# Patient Record
Sex: Male | Born: 1995 | Race: White | Hispanic: No | Smoking: Never smoker
Health system: Southern US, Community
[De-identification: ages and names within clinical notes are randomized; demographics above are authoritative.]

## PROBLEM LIST (undated history)

## (undated) HISTORY — PX: CIRCUMCISION: SHX1350

---
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
## 2017-06-26 DIAGNOSIS — Z23 Encounter for immunization: Secondary | ICD-10-CM | POA: Diagnosis not present

## 2017-06-26 DIAGNOSIS — L700 Acne vulgaris: Secondary | ICD-10-CM | POA: Diagnosis not present

## 2017-06-26 DIAGNOSIS — L649 Androgenic alopecia, unspecified: Secondary | ICD-10-CM | POA: Diagnosis not present

## 2018-03-19 DIAGNOSIS — Z13220 Encounter for screening for lipoid disorders: Secondary | ICD-10-CM | POA: Diagnosis not present

## 2018-03-19 DIAGNOSIS — Z0000 Encounter for general adult medical examination without abnormal findings: Secondary | ICD-10-CM | POA: Diagnosis not present

## 2018-03-19 DIAGNOSIS — Z130 Encounter for screening for diseases of the blood and blood-forming organs and certain disorders involving the immune mechanism: Secondary | ICD-10-CM | POA: Diagnosis not present

## 2018-10-19 ENCOUNTER — Ambulatory Visit (INDEPENDENT_AMBULATORY_CARE_PROVIDER_SITE_OTHER): Payer: Self-pay | Admitting: Nurse Practitioner

## 2018-10-19 DIAGNOSIS — Z111 Encounter for screening for respiratory tuberculosis: Secondary | ICD-10-CM | POA: Insufficient documentation

## 2018-10-19 DIAGNOSIS — Z23 Encounter for immunization: Secondary | ICD-10-CM

## 2018-10-19 NOTE — Progress Notes (Signed)
Patient presents for PPD placement for  Denies previous positive TB test  Denies known exposure to TB   Tuberculin skin test applied to right forearm.  Patient informed to return to [facility] in 48-72 hours for PPD read. Vaccine Information Statement provided to patient.  Pt presents here today for visit to receive Influenza(right deltoid) vaccine. Allergies reviewed, vaccine given, vaccine information statement provided, tolerated well.

## 2018-10-22 LAB — TB SKIN TEST
INDURATION: 0 mm
TB Skin Test: NEGATIVE

## 2021-01-05 DIAGNOSIS — J3081 Allergic rhinitis due to animal (cat) (dog) hair and dander: Secondary | ICD-10-CM | POA: Diagnosis not present

## 2021-01-05 DIAGNOSIS — J3089 Other allergic rhinitis: Secondary | ICD-10-CM | POA: Diagnosis not present

## 2021-01-05 DIAGNOSIS — Z91013 Allergy to seafood: Secondary | ICD-10-CM | POA: Diagnosis not present

## 2022-04-14 ENCOUNTER — Other Ambulatory Visit (HOSPITAL_COMMUNITY): Payer: Self-pay

## 2022-08-08 ENCOUNTER — Ambulatory Visit: Payer: 59 | Admitting: Nurse Practitioner

## 2023-12-30 ENCOUNTER — Emergency Department
Admission: EM | Admit: 2023-12-30 | Discharge: 2023-12-30 | Disposition: A | Discharge status: Home / Self Care | Attending: Emergency Medicine | Admitting: Emergency Medicine

## 2023-12-30 ENCOUNTER — Encounter: Payer: Self-pay | Admitting: Intensive Care

## 2023-12-30 ENCOUNTER — Other Ambulatory Visit: Payer: Self-pay

## 2023-12-30 ENCOUNTER — Emergency Department

## 2023-12-30 DIAGNOSIS — R112 Nausea with vomiting, unspecified: Secondary | ICD-10-CM

## 2023-12-30 DIAGNOSIS — R1033 Periumbilical pain: Secondary | ICD-10-CM | POA: Insufficient documentation

## 2023-12-30 DIAGNOSIS — E860 Dehydration: Secondary | ICD-10-CM

## 2023-12-30 DIAGNOSIS — K5730 Diverticulosis of large intestine without perforation or abscess without bleeding: Secondary | ICD-10-CM | POA: Diagnosis not present

## 2023-12-30 DIAGNOSIS — R197 Diarrhea, unspecified: Secondary | ICD-10-CM | POA: Insufficient documentation

## 2023-12-30 DIAGNOSIS — R1031 Right lower quadrant pain: Secondary | ICD-10-CM | POA: Diagnosis not present

## 2023-12-30 LAB — LIPASE, BLOOD: Lipase: 32 U/L (ref 11–51)

## 2023-12-30 LAB — URINALYSIS, ROUTINE W REFLEX MICROSCOPIC
Bilirubin Urine: NEGATIVE
Glucose, UA: NEGATIVE mg/dL
Hgb urine dipstick: NEGATIVE
Ketones, ur: 20 mg/dL — AB
Leukocytes,Ua: NEGATIVE
Nitrite: NEGATIVE
Protein, ur: NEGATIVE mg/dL
Specific Gravity, Urine: 1.018 (ref 1.005–1.030)
pH: 9 — ABNORMAL HIGH (ref 5.0–8.0)

## 2023-12-30 LAB — COMPREHENSIVE METABOLIC PANEL WITH GFR
ALT: 12 U/L (ref 0–44)
AST: 24 U/L (ref 15–41)
Albumin: 5.6 g/dL — ABNORMAL HIGH (ref 3.5–5.0)
Alkaline Phosphatase: 45 U/L (ref 38–126)
Anion gap: 13 (ref 5–15)
BUN: 10 mg/dL (ref 6–20)
CO2: 22 mmol/L (ref 22–32)
Calcium: 10.2 mg/dL (ref 8.9–10.3)
Chloride: 104 mmol/L (ref 98–111)
Creatinine, Ser: 0.88 mg/dL (ref 0.61–1.24)
GFR, Estimated: >60 mL/min (ref >60)
Glucose, Bld: 101 mg/dL — ABNORMAL HIGH (ref 70–99)
Potassium: 3 mmol/L — ABNORMAL LOW (ref 3.5–5.1)
Sodium: 139 mmol/L (ref 135–145)
Total Bilirubin: 2.1 mg/dL — ABNORMAL HIGH (ref 0.0–1.2)
Total Protein: 9.1 g/dL — ABNORMAL HIGH (ref 6.5–8.1)

## 2023-12-30 LAB — URINE DRUG SCREEN, QUALITATIVE (ARMC ONLY)
Amphetamines, Ur Screen: NOT DETECTED
Barbiturates, Ur Screen: NOT DETECTED
Benzodiazepine, Ur Scrn: NOT DETECTED
Cannabinoid 50 Ng, Ur ~~LOC~~: POSITIVE — AB
Cocaine Metabolite,Ur ~~LOC~~: NOT DETECTED
MDMA (Ecstasy)Ur Screen: NOT DETECTED
Methadone Scn, Ur: NOT DETECTED
Opiate, Ur Screen: NOT DETECTED
Phencyclidine (PCP) Ur S: NOT DETECTED
Tricyclic, Ur Screen: NOT DETECTED

## 2023-12-30 LAB — CBC
HCT: 43.7 % (ref 39.0–52.0)
Hemoglobin: 16 g/dL (ref 13.0–17.0)
MCH: 29.6 pg (ref 26.0–34.0)
MCHC: 36.6 g/dL — ABNORMAL HIGH (ref 30.0–36.0)
MCV: 80.8 fL (ref 80.0–100.0)
Platelets: 339 10*3/uL (ref 150–400)
RBC: 5.41 MIL/uL (ref 4.22–5.81)
RDW: 12.3 % (ref 11.5–15.5)
WBC: 5.3 10*3/uL (ref 4.0–10.5)
nRBC: 0 % (ref 0.0–0.2)

## 2023-12-30 LAB — MAGNESIUM: Magnesium: 2.1 mg/dL (ref 1.7–2.4)

## 2023-12-30 MED ORDER — IOHEXOL 300 MG/ML  SOLN
100.0000 mL | Freq: Once | INTRAMUSCULAR | Status: AC | PRN
  Administered 2023-12-30: 100 mL via INTRAVENOUS

## 2023-12-30 MED ORDER — OMEPRAZOLE MAGNESIUM 20 MG PO TBEC: 20.0000 mg | DELAYED_RELEASE_TABLET | Freq: Every day | ORAL | 0 refills | Status: AC

## 2023-12-30 MED ORDER — POTASSIUM CHLORIDE 10 MEQ/100ML IV SOLN
10.0000 meq | Freq: Once | INTRAVENOUS | Status: AC
  Administered 2023-12-30: 10 meq via INTRAVENOUS
  Filled 2023-12-30: qty 100

## 2023-12-30 MED ORDER — KETOROLAC TROMETHAMINE 15 MG/ML IJ SOLN
15.0000 mg | Freq: Once | INTRAMUSCULAR | Status: AC
  Administered 2023-12-30: 15 mg via INTRAVENOUS
  Filled 2023-12-30: qty 1

## 2023-12-30 MED ORDER — SODIUM CHLORIDE 0.9 % IV BOLUS
500.0000 mL | Freq: Once | INTRAVENOUS | Status: AC
  Administered 2023-12-30: 500 mL via INTRAVENOUS

## 2023-12-30 MED ORDER — FAMOTIDINE IN NACL 20-0.9 MG/50ML-% IV SOLN
20.0000 mg | Freq: Once | INTRAVENOUS | Status: AC
  Administered 2023-12-30: 20 mg via INTRAVENOUS
  Filled 2023-12-30: qty 50

## 2023-12-30 MED ORDER — ONDANSETRON HCL 4 MG/2ML IJ SOLN
4.0000 mg | Freq: Once | INTRAMUSCULAR | Status: AC
  Administered 2023-12-30: 4 mg via INTRAVENOUS
  Filled 2023-12-30: qty 2

## 2023-12-30 MED ORDER — SODIUM CHLORIDE 0.9 % IV BOLUS
1000.0000 mL | Freq: Once | INTRAVENOUS | Status: AC
  Administered 2023-12-30: 1000 mL via INTRAVENOUS

## 2023-12-30 NOTE — ED Provider Notes (Signed)
 [facility] Provider Note    Event Date/Time   First MD Initiated Contact with Patient 12/30/23 1641     (approximate)   History   Abdominal Pain   HPI  Travis Green is a 28 y.o. male no significant past medical history presents to the emergency department with abdominal pain.  Endorses 3 days of abdominal pain that is in the middle and right side.  Associated with chills and nausea and vomiting.  Multiple episodes of diarrhea.  Denies any blood in his stool or vomit.  Denies any significant alcohol use, marijuana use or ibuprofen use.  No similar symptoms but does state that he has been having some stomach issues recently but has not been evaluated.  No prior abdominal surgery.  Denies dysuria, urinary urgency or frequency.  No recent travel.  No recent antibiotic use.  No family history of IBD.  Mother has a family history of IBS.     Physical Exam   Triage Vital Signs: ED Triage Vitals  Encounter Vitals Group     BP 12/30/23 1635 (!) 122/96     Systolic BP Percentile --      Diastolic BP Percentile --      Pulse Rate 12/30/23 1638 (!) 160     Resp 12/30/23 1635 (!) 24     Temp 12/30/23 1635 97.9 F (36.6 C)     Temp Source 12/30/23 1635 Oral     SpO2 12/30/23 1638 100 %     Weight 12/30/23 1636 167 lb (75.8 kg)     Height 12/30/23 1636 6' 1.5" (1.867 m)     Head Circumference --      Peak Flow --      Pain Score 12/30/23 1636 10     Pain Loc --      Pain Education --      Exclude from Growth Chart --     Most recent vital signs: Vitals:   12/30/23 1830 12/30/23 1845  BP:    Pulse: 91 68  Resp: 18 11  Temp:    SpO2: 100% 100%    Physical Exam Constitutional:      Appearance: He is well-developed. He is ill-appearing.  HENT:     Head: Atraumatic.  Eyes:     Conjunctiva/sclera: Conjunctivae normal.  Cardiovascular:     Rate and Rhythm: Regular rhythm. Tachycardia present.  Pulmonary:     Effort: No respiratory distress.      Breath sounds: No wheezing.  Abdominal:     Tenderness: There is abdominal tenderness in the right lower quadrant and periumbilical area.  Musculoskeletal:     Cervical back: Normal range of motion.  Skin:    General: Skin is warm and dry.  Neurological:     General: No focal deficit present.     Mental Status: He is alert. Mental status is at baseline.     IMPRESSION / MDM / ASSESSMENT AND PLAN / ED COURSE  I reviewed the triage vital signs and the nursing notes.  Differential diagnosis including acute cystitis, intra-abdominal abscess, gastritis/PUD, viral gastroenteritis, cyclical vomiting, dehydration  On arrival afebrile, tachycardic to 160 and tachypneic with a normal blood pressure.  Immediately ordered IV fluids and IV antiemetics.  CT scan ordered and made NPO.  EKG  I, Viviano Ground, the attending physician, personally viewed and interpreted this ECG.   Rate: Normal  Rhythm: Normal sinus  Axis: Normal  Intervals: QT prolonged - 482  ST&T Change:  None  Sinus tachycardia while on cardiac telemetry.  RADIOLOGY I independently reviewed imaging, my interpretation of imaging: CT scan abdomen and pelvis with contrast -findings concerning for possible colitis.  No signs of bowel obstruction.  LABS (all labs ordered are listed, but only abnormal results are displayed) Labs interpreted as -    Labs Reviewed  COMPREHENSIVE METABOLIC PANEL WITH GFR - Abnormal; Notable for the following components:      Result Value   Potassium 3.0 (*)    Glucose, Bld 101 (*)    Total Protein 9.1 (*)    Albumin 5.6 (*)    Total Bilirubin 2.1 (*)    All other components within normal limits  CBC - Abnormal; Notable for the following components:   MCHC 36.6 (*)    All other components within normal limits  URINALYSIS, ROUTINE W REFLEX MICROSCOPIC - Abnormal; Notable for the following components:   Color, Urine STRAW (*)    APPearance CLEAR (*)    pH 9.0 (*)    Ketones, ur 20 (*)     All other components within normal limits  URINE DRUG SCREEN, QUALITATIVE ([facility] ONLY) - Abnormal; Notable for the following components:   Cannabinoid 50 Ng, Ur [place] POSITIVE (*)    All other components within normal limits  LIPASE, BLOOD  MAGNESIUM     MDM  Patient given IV fluids 2 L potassium replacement.  Given IV antiemetics with Zofran.  CT scan without findings of acute appendicitis.  Findings concerning for possible colitis.  No concern for C. difficile.  No blood in the stool.  On reevaluation patient states he is feeling much better.  Tolerating p.o.  Will repeat an EKG to see if QT interval improved.  Will discharge the patient on a PPI.  Discussed return precautions.  Discussed follow-up with primary care physician and given information to follow-up with gastroenterology.  No questions at time of discharge.     PROCEDURES:  Critical Care performed: no  Procedures  Patient's presentation is most consistent with acute presentation with potential threat to life or bodily function.   MEDICATIONS ORDERED IN ED: Medications  sodium chloride 0.9 % bolus 1,000 mL (0 mLs Intravenous Stopped 12/30/23 1821)  ondansetron (ZOFRAN) injection 4 mg (4 mg Intravenous Given 12/30/23 1659)  ketorolac (TORADOL) 15 MG/ML injection 15 mg (15 mg Intravenous Given 12/30/23 1725)  famotidine (PEPCID) IVPB 20 mg premix (0 mg Intravenous Stopped 12/30/23 1758)  iohexol (OMNIPAQUE) 300 MG/ML solution 100 mL (100 mLs Intravenous Contrast Given 12/30/23 1740)  potassium chloride 10 mEq in 100 mL IVPB (10 mEq Intravenous New Bag/Given 12/30/23 1829)  sodium chloride 0.9 % bolus 500 mL (500 mLs Intravenous New Bag/Given 12/30/23 1858)    FINAL CLINICAL IMPRESSION(S) / ED DIAGNOSES   Final diagnoses:  Right lower quadrant abdominal pain  Nausea vomiting and diarrhea  Dehydration     Rx / DC Orders   ED Discharge Orders     None        Note:  This document was prepared using Dragon voice  recognition software and may include unintentional dictation errors.   Viviano Ground, MD 12/30/23 [phone]

## 2023-12-30 NOTE — ED Triage Notes (Signed)
 Patient presents with abdominal pain with nausea and vomiting that started three days ago. Has progressively gotten worse

## 2023-12-30 NOTE — Discharge Instructions (Addendum)
 You are seen in the emergency department with nausea vomiting and diarrhea.  You had a CT scan that showed some inflammation of your colon.  You had mildly low potassium level that was repleted in the emergency department.  Stay hydrated and drink plenty of fluids.  Follow-up closely with your primary care physician.  You are given a prescription for an acid reducing medication.  Bland diet with small frequent meals as discussed.  Return to the emergency department for any return of symptoms.  Your potassium was mildly low when checked today.  Make sure to follow up with a primary doctor to follow up your labs.  Make sure to eat food high in potassium and magnesium - examples - potatoes, spinach, bananas, beans, avocadoes, oranges, nuts.
